# Patient Record
Sex: Male | Born: 1935 | Race: White | Hispanic: No | Marital: Married | Smoking: Never smoker
Health system: Southern US, Community
[De-identification: ages and names within clinical notes are randomized; demographics above are authoritative.]

## PROBLEM LIST (undated history)

## (undated) HISTORY — PX: CORONARY STENT PLACEMENT: SHX1402

---

## 2014-03-18 ENCOUNTER — Encounter (HOSPITAL_COMMUNITY): Payer: Self-pay | Admitting: Emergency Medicine

## 2014-03-18 ENCOUNTER — Emergency Department (HOSPITAL_COMMUNITY)
Admission: EM | Admit: 2014-03-18 | Discharge: 2014-03-18 | Disposition: A | Discharge status: Home / Self Care | Payer: Medicare Other | Attending: Emergency Medicine | Admitting: Emergency Medicine

## 2014-03-18 ENCOUNTER — Emergency Department (HOSPITAL_COMMUNITY): Payer: Medicare Other

## 2014-03-18 DIAGNOSIS — Z9861 Coronary angioplasty status: Secondary | ICD-10-CM | POA: Insufficient documentation

## 2014-03-18 DIAGNOSIS — X58XXXA Exposure to other specified factors, initial encounter: Secondary | ICD-10-CM | POA: Insufficient documentation

## 2014-03-18 DIAGNOSIS — S8012XA Contusion of left lower leg, initial encounter: Secondary | ICD-10-CM

## 2014-03-18 DIAGNOSIS — S8010XA Contusion of unspecified lower leg, initial encounter: Secondary | ICD-10-CM | POA: Insufficient documentation

## 2014-03-18 DIAGNOSIS — Z79899 Other long term (current) drug therapy: Secondary | ICD-10-CM | POA: Insufficient documentation

## 2014-03-18 DIAGNOSIS — Z7982 Long term (current) use of aspirin: Secondary | ICD-10-CM | POA: Insufficient documentation

## 2014-03-18 DIAGNOSIS — Y9389 Activity, other specified: Secondary | ICD-10-CM | POA: Insufficient documentation

## 2014-03-18 DIAGNOSIS — Z7902 Long term (current) use of antithrombotics/antiplatelets: Secondary | ICD-10-CM | POA: Insufficient documentation

## 2014-03-18 DIAGNOSIS — Y9289 Other specified places as the place of occurrence of the external cause: Secondary | ICD-10-CM | POA: Insufficient documentation

## 2014-03-18 LAB — CBC WITH DIFFERENTIAL/PLATELET
BASOS ABS: 0 10*3/uL (ref 0.0–0.1)
BASOS PCT: 0 % (ref 0–1)
EOS ABS: 0 10*3/uL (ref 0.0–0.7)
Eosinophils Relative: 0 % (ref 0–5)
HCT: 41.7 % (ref 39.0–52.0)
Hemoglobin: 14.2 g/dL (ref 13.0–17.0)
Lymphocytes Relative: 19 % (ref 12–46)
Lymphs Abs: 1.9 10*3/uL (ref 0.7–4.0)
MCH: 32.3 pg (ref 26.0–34.0)
MCHC: 34.1 g/dL (ref 30.0–36.0)
MCV: 95 fL (ref 78.0–100.0)
Monocytes Absolute: 0.7 10*3/uL (ref 0.1–1.0)
Monocytes Relative: 7 % (ref 3–12)
NEUTROS PCT: 74 % (ref 43–77)
Neutro Abs: 7.6 10*3/uL (ref 1.7–7.7)
PLATELETS: 176 10*3/uL (ref 150–400)
RBC: 4.39 MIL/uL (ref 4.22–5.81)
RDW: 14.7 % (ref 11.5–15.5)
WBC: 10.2 10*3/uL (ref 4.0–10.5)

## 2014-03-18 LAB — COMPREHENSIVE METABOLIC PANEL
ALK PHOS: 70 U/L (ref 39–117)
ALT: 17 U/L (ref 0–53)
AST: 26 U/L (ref 0–37)
Albumin: 3.9 g/dL (ref 3.5–5.2)
BUN: 24 mg/dL — ABNORMAL HIGH (ref 6–23)
CO2: 26 mEq/L (ref 19–32)
Calcium: 9.2 mg/dL (ref 8.4–10.5)
Chloride: 104 mEq/L (ref 96–112)
Creatinine, Ser: 0.88 mg/dL (ref 0.50–1.35)
GFR calc Af Amer: >90 mL/min (ref >90)
GFR calc non Af Amer: 81 mL/min — ABNORMAL LOW (ref >90)
Glucose, Bld: 93 mg/dL (ref 70–99)
POTASSIUM: 3.9 meq/L (ref 3.7–5.3)
SODIUM: 142 meq/L (ref 137–147)
TOTAL PROTEIN: 6.9 g/dL (ref 6.0–8.3)
Total Bilirubin: 0.3 mg/dL (ref 0.3–1.2)

## 2014-03-18 LAB — APTT: aPTT: 27 seconds (ref 24–37)

## 2014-03-18 LAB — PROTIME-INR
INR: 0.97 (ref 0.00–1.49)
Prothrombin Time: 12.7 seconds (ref 11.6–15.2)

## 2014-03-18 LAB — I-STAT CG4 LACTIC ACID, ED: LACTIC ACID, VENOUS: 0.86 mmol/L (ref 0.5–2.2)

## 2014-03-18 MED ORDER — SODIUM CHLORIDE 0.9 % IV SOLN
INTRAVENOUS | Status: DC
  Administered 2014-03-18: 16:00:00 via INTRAVENOUS

## 2014-03-18 MED ORDER — PIPERACILLIN-TAZOBACTAM 3.375 G IVPB
3.3750 g | Freq: Once | INTRAVENOUS | Status: AC
  Administered 2014-03-18: 3.375 g via INTRAVENOUS
  Filled 2014-03-18: qty 50

## 2014-03-18 MED ORDER — VANCOMYCIN HCL IN DEXTROSE 1-5 GM/200ML-% IV SOLN
1000.0000 mg | Freq: Once | INTRAVENOUS | Status: DC
  Filled 2014-03-18: qty 200

## 2014-03-18 MED ORDER — OXYCODONE-ACETAMINOPHEN 5-325 MG PO TABS: 1.0000 | ORAL_TABLET | ORAL | Status: AC | PRN

## 2014-03-18 MED ORDER — IOHEXOL 300 MG/ML  SOLN
100.0000 mL | Freq: Once | INTRAMUSCULAR | Status: AC | PRN
  Administered 2014-03-18: 100 mL via INTRAVENOUS

## 2014-03-18 NOTE — Progress Notes (Signed)
Orthopedic Tech Progress Note Patient Details:  Ninfa LindenJames Jarnigan 1936-10-14 [id number]  Ortho Devices Type of Ortho Device: Roland RackUnna boot Ortho Device/Splint Location: lle Ortho Device/Splint Interventions: Application   Nikki Domrawford, Francella Barnett 03/18/2014, 8:18 PM

## 2014-03-18 NOTE — Consult Note (Signed)
Reason for Consult: Bruce swelling anterior left tibia concern for necrotizing fasciitis Referring Physician: Dr. Grayce Sessions is an 78 y.o. male.  HPI: Patient is a 78 year old gentleman from [place] who is status post cardiac stents he is on both aspirin and Effient, both antiplatelet drugs. 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 Patient noticed bruising and swelling and was brought to the emergency room.  History reviewed. No pertinent past medical history.  Past Surgical History  Procedure Laterality Date  . Coronary stent placement      History reviewed. No pertinent family history.  Social History:  reports that he has never smoked. He does not have any smokeless tobacco history on file. He reports that he does not drink alcohol or use illicit drugs.  Allergies: No Known Allergies  Medications: I have reviewed the patient's current medications.  Results for orders placed during the hospital encounter of 03/18/14 (from the past 48 hour(s))  CBC WITH DIFFERENTIAL     Status: None   Collection Time    03/18/14  4:00 PM      Result Value Ref Range   WBC 10.2  4.0 - 10.5 K/uL   RBC 4.39  4.22 - 5.81 MIL/uL   Hemoglobin 14.2  13.0 - 17.0 g/dL   HCT 41.7  39.0 - 52.0 %   MCV 95.0  78.0 - 100.0 fL   MCH 32.3  26.0 - 34.0 pg   MCHC 34.1  30.0 - 36.0 g/dL   RDW 14.7  11.5 - 15.5 %   Platelets 176  150 - 400 K/uL   Neutrophils Relative % 74  43 - 77 %   Neutro Abs 7.6  1.7 - 7.7 K/uL   Lymphocytes Relative 19  12 - 46 %   Lymphs Abs 1.9  0.7 - 4.0 K/uL   Monocytes Relative 7  3 - 12 %   Monocytes Absolute 0.7  0.1 - 1.0 K/uL   Eosinophils Relative 0  0 - 5 %   Eosinophils Absolute 0.0  0.0 - 0.7 K/uL   Basophils Relative 0  0 - 1 %   Basophils Absolute 0.0  0.0 - 0.1 K/uL  COMPREHENSIVE METABOLIC PANEL     Status: Abnormal   Collection Time    03/18/14  4:00 PM      Result Value Ref Range   Sodium 142  137 -  147 mEq/L   Potassium 3.9  3.7 - 5.3 mEq/L   Chloride 104  96 - 112 mEq/L   CO2 26  19 - 32 mEq/L   Glucose, Bld 93  70 - 99 mg/dL   BUN 24 (*) 6 - 23 mg/dL   Creatinine, Ser 0.88  0.50 - 1.35 mg/dL   Calcium 9.2  8.4 - 10.5 mg/dL   Total Protein 6.9  6.0 - 8.3 g/dL   Albumin 3.9  3.5 - 5.2 g/dL   AST 26  0 - 37 U/L   ALT 17  0 - 53 U/L   Alkaline Phosphatase 70  39 - 117 U/L   Total Bilirubin 0.3  0.3 - 1.2 mg/dL   GFR calc non Af Amer 81 (*) >90 mL/min   GFR calc Af Amer >90  >90 mL/min   Comment: (NOTE)     The eGFR has been calculated using the CKD EPI equation.     This calculation has not been validated in all clinical situations.  eGFR's persistently <90 mL/min signify possible Chronic Kidney     Disease.  PROTIME-INR     Status: None   Collection Time    03/18/14  4:00 PM      Result Value Ref Range   Prothrombin Time 12.7  11.6 - 15.2 seconds   INR 0.97  0.00 - 1.49  APTT     Status: None   Collection Time    03/18/14  4:00 PM      Result Value Ref Range   aPTT 27  24 - 37 seconds  I-STAT CG4 LACTIC ACID, ED     Status: None   Collection Time    03/18/14  4:19 PM      Result Value Ref Range   Lactic Acid, Venous 0.86  0.5 - 2.2 mmol/L    Dg Tibia/fibula Left  03/18/2014   CLINICAL DATA:  Recent traumatic injury with pain  EXAM: LEFT TIBIA AND FIBULA - 2 VIEW  COMPARISON:  None.  FINDINGS: No acute fracture or dislocation is noted. Degenerative changes about the knee joint are noted. Diffuse vascular calcifications are seen.  IMPRESSION: No acute abnormality noted.   Electronically Signed   By: Inez Catalina M.D.   On: 03/18/2014 16:38   Ct Tibia Fibula Left W Contrast  03/18/2014   CLINICAL DATA:  Leg pain and swelling.  EXAM: CT OF THE LEFT TIBIA AND FIBULA WITH CONTRAST  TECHNIQUE: Multidetector CT imaging of the left lower extremity was performed according to the standard protocol following intravenous contrast administration. Multiplanar CT image  reconstructions were also generated.  CONTRAST:  139m OMNIPAQUE IOHEXOL 300 MG/ML  SOLN  COMPARISON:  None.  FINDINGS: There is diffuse subcutaneous soft tissue swelling/ edema involving the anterior and medial aspect of the lower leg along with a more discrete hematoma measuring 7.5 x 5.5 x 2.5 cm.  The muscular compartment is normal. The fascia planes are maintained. No intramuscular hematoma.  There is a small joint effusion and moderate degenerative changes involving the with chondrocalcinosis, joint space narrowing and osteophytic spurring. No acute fracture or destructive bony changes.  Extensive arterial calcifications are noted. Mixing on opacified blood and contrast noted in the popliteal vein.  IMPRESSION: Large hematoma in the mid tibial region medially and anteriorly with significant surrounding hemorrhage, edema and fluid along the medial aspect of the entire lower leg.  No acute bony findings.  Degenerative changes noted at the knee.  Extensive arterial calcifications.   Electronically Signed   By: MKalman JewelsM.D.   On: 03/18/2014 18:54    Review of Systems  All other systems reviewed and are negative.   Blood pressure 136/78, pulse 66, temperature 98.6 F (37 C), temperature source Oral, resp. rate 18, SpO2 100.00%. Physical Exam On examination patient has a palpable dorsalis pedis pulse in the left leg. There is no crepitation to palpation. He has soft compartments which shows no evidence of compartment syndrome. He does have a hematoma anteriorly over the tibia with no skin no drainage. Review of the CT scan and radiographs shows no evidence of necrotizing fasciitis. He does have a large hematoma anteriorly over the tibia which does not communicate with the compartments. There is no skin ischemia or necrosis. Assessment/Plan: Assessment large hematoma left leg anterior to the tibia with no compartment involvement no necrotizing fasciitis no compartment syndrome no skin ischemia or  necrosis.  Plan: We will have his cardiologist consult to see if we can discontinue his anti- platelet therapy.  I will place an UNNA compressive wrap the left lower extremity. He will adhere to strict elevation 24 hours a day and will followup with a wound clinic or a foot and ankle surgeon in [place]. Discussed that he will need to followup medically weekly to evaluate the leg. If he develops any acute pain or acute drainage he would have to followup emergently. Patient wishes to return to [facility] I will followup as needed.  Hymie Gorr V 03/18/2014, 7:20 PM

## 2014-03-18 NOTE — ED Provider Notes (Signed)
CSN: [id number]     Arrival date & time 03/18/14  1405 History   First MD Initiated Contact with Patient 03/18/14 1540     Chief Complaint  Patient presents with  . Leg Pain     (Consider location/radiation/quality/duration/timing/severity/associated sxs/prior Treatment) Patient is a 78 y.o. male presenting with leg pain. The history is provided by the patient.  Leg Pain  patient here complaining of left lower extremity pain and swelling x4 hours. Patient was working outside and was in weeds and felt something brushed up against his mid lower extremity. He shortly thereafter developed pain characterized as sharp and worse with standing. He is also noted increased swelling to his mid anterior tibia that has been extending up his leg.Marland Kitchen He denies any fever or chills. Denies seeing any snakes. Symptoms have been progressively worse. No treatment used prior to arrival. He does not use any blood thinners. Denies any history of direct trauma. No bruising noted in the other places on his body.  History reviewed. No pertinent past medical history. Past Surgical History  Procedure Laterality Date  . Coronary stent placement     History reviewed. No pertinent family history. History  Substance Use Topics  . Smoking status: Never Smoker   . Smokeless tobacco: Not on file  . Alcohol Use: No    Review of Systems  All other systems reviewed and are negative.      Allergies  Review of patient's allergies indicates no known allergies.  Home Medications   Current Outpatient Rx  Name  Route  Sig  Dispense  Refill  . aspirin EC 81 MG tablet   Oral   Take 81 mg by mouth daily.         Marland Kitchen donepezil (ARICEPT) 5 MG tablet   Oral   Take 5 mg by mouth at bedtime.         . finasteride (PROSCAR) 5 MG tablet   Oral   Take 5 mg by mouth daily.         . fluvastatin XL (LESCOL XL) 80 MG 24 hr tablet   Oral   Take 80 mg by mouth daily.         Marland Kitchen losartan (COZAAR) 50 MG tablet    Oral   Take 50 mg by mouth daily.         . metoprolol succinate (TOPROL-XL) 50 MG 24 hr tablet   Oral   Take 50 mg by mouth daily. Take with or immediately following a meal.         . Multiple Vitamins-Minerals (MULTIVITAMIN WITH MINERALS) tablet   Oral   Take 1 tablet by mouth daily.         . niacin (NIASPAN) 500 MG CR tablet   Oral   Take 2,000 mg by mouth at bedtime.         . Omega-3 Fatty Acids (SALMON OIL-1000 PO)   Oral   Take 2,000 Units by mouth daily.         . prasugrel (EFFIENT) 10 MG TABS tablet   Oral   Take 10 mg by mouth daily.         . Vitamins A & D (VITAMIN A & D) [address] UNITS TABS   Oral   Take 1 tablet by mouth daily.          BP 150/77  Pulse 84  Temp(Src) 98.6 F (37 C) (Oral)  Resp 18  SpO2 96% Physical Exam  Nursing note and  vitals reviewed. Constitutional: He is oriented to person, place, and time. He appears well-developed and well-nourished.  Non-toxic appearance. No distress.  HENT:  Head: Normocephalic and atraumatic.  Eyes: Conjunctivae, EOM and lids are normal. Pupils are equal, round, and reactive to light.  Neck: Normal range of motion. Neck supple. No tracheal deviation present. No mass present.  Cardiovascular: Normal rate, regular rhythm and normal heart sounds.  Exam reveals no gallop.   No murmur heard. Pulmonary/Chest: Effort normal and breath sounds normal. No stridor. No respiratory distress. He has no decreased breath sounds. He has no wheezes. He has no rhonchi. He has no rales.  Abdominal: Soft. Normal appearance and bowel sounds are normal. He exhibits no distension. There is no tenderness. There is no rebound and no CVA tenderness.  Musculoskeletal: Normal range of motion. He exhibits no edema and no tenderness.       Legs: Neurological: He is alert and oriented to person, place, and time. He has normal strength. No cranial nerve deficit or sensory deficit. GCS eye subscore is 4. GCS verbal subscore is  5. GCS motor subscore is 6.  Skin: Skin is warm and dry. No abrasion and no rash noted.  Psychiatric: He has a normal mood and affect. His speech is normal and behavior is normal.    ED Course  Procedures (including critical care time) Labs Review Labs Reviewed  CULTURE, BLOOD (ROUTINE X 2)  CULTURE, BLOOD (ROUTINE X 2)  CBC WITH DIFFERENTIAL  COMPREHENSIVE METABOLIC PANEL  PROTIME-INR  APTT  I-STAT CG4 LACTIC ACID, ED   Imaging Review No results found.   EKG Interpretation None      MDM   Final diagnoses:  None    Spoke with orthopedics on-call and Dr. Lajoyce Cornersduda will come to see the patient. Patient apparently started on antibiotics. Patient rechecked and no extension out the marked lines on the patient's left lower extremity. We'll continue to monitor.  7:46 PM Patient seen by the orthopedist on call and he has no evidence of compartment syndrome. Patient does actually take a blood thinner and the CT showed no signs of necrotizing fasciitis but does show an extensive hematoma. I spoke with the patient's cardiologist, [place] [facility] [facility] [place] and he agrees with taking the patient off of his antiplatelet medication. Straight preterm precautions for compartment syndrome given to the patient. The wound is on call as written for a orthopedic boot. Patient's hematoma was marked when he first arrived and on discharge it is not expanded. He has no evidence of progressive or metastatic. Neurovascular intact.  Toy BakerAnthony T Stanton Kissoon, MD 03/18/14 [phone]

## 2014-03-18 NOTE — ED Notes (Signed)
Ortho called to apply [employer].

## 2014-03-18 NOTE — ED Notes (Signed)
MD Lajoyce Cornersuda with ortho at bedside.

## 2014-03-18 NOTE — ED Notes (Signed)
Pt was working outside cutting brush when he noticed a discomfort in his LLE. He raised his pant leg and saw punctures with bleeding and bruising. Afraid he may have been bitten by a snake. He states there is mild pain at the site. Denies other complaints now.

## 2014-03-18 NOTE — ED Notes (Signed)
Ortho tech at bedside 

## 2014-03-18 NOTE — ED Notes (Signed)
NOTIFIEED DR. A. ALLEN IN PERSON OF PATIENTS LAB RESULTS OF CG4+ LACTIC ACID @16 :19 PM ,03/18/2014.

## 2014-03-18 NOTE — ED Notes (Signed)
Pt discharged home with all belongings, alert, oriented, and ambulatory upon discharge, pt escorted to ED entrance via wheel chair by this RN, 1 new rx prescribed, pt verbalizung understanding of discharge instructions

## 2014-03-18 NOTE — ED Notes (Signed)
Pt presents with concern for a possible snake bite to his Left lower extremity. Pt reports he was working in the yard cutting back bushes in his yard approx 1330. Pt felt there was possibly 2 puncture areas to his Left lower leg. Pt also reports swelling, discoloration, and tightness to his Left lower leg since arriving to the ED.

## 2014-03-18 NOTE — Discharge Instructions (Signed)
Go to the closest hospital if you develop numbness or tingling in your left foot, severe pain to the foot, extension of the swelling to your left lower leg. Followup with your cardiologist as well as with your primary care Dr. Hematoma A hematoma is a collection of blood under the skin, in an organ, in a body space, in a joint space, or in other tissue. The blood can clot to form a lump that you can see and feel. The lump is often firm and may sometimes become sore and tender. Most hematomas get better in a few days to weeks. However, some hematomas may be serious and require medical care. Hematomas can range in size from very small to very large. CAUSES  A hematoma can be caused by a blunt or penetrating injury. It can also be caused by spontaneous leakage from a blood vessel under the skin. Spontaneous leakage from a blood vessel is more likely to occur in older people, especially those taking blood thinners. Sometimes, a hematoma can develop after certain medical procedures. SIGNS AND SYMPTOMS   A firm lump on the body.  Possible pain and tenderness in the area.  Bruising.Blue, dark blue, purple-red, or yellowish skin may appear at the site of the hematoma if the hematoma is close to the surface of the skin. For hematomas in deeper tissues or body spaces, the signs and symptoms may be subtle. For example, an intra-abdominal hematoma may cause abdominal pain, weakness, fainting, and shortness of breath. An intracranial hematoma may cause a headache or symptoms such as weakness, trouble speaking, or a change in consciousness. DIAGNOSIS  A hematoma can usually be diagnosed based on your medical history and a physical exam. Imaging tests may be needed if your health care provider suspects a hematoma in deeper tissues or body spaces, such as the abdomen, head, or chest. These tests may include ultrasonography or a CT scan.  TREATMENT  Hematomas usually go away on their own over time. Rarely does the  blood need to be drained out of the body. Large hematomas or those that may affect vital organs will sometimes need surgical drainage or monitoring. HOME CARE INSTRUCTIONS   Apply ice to the injured area:   Put ice in a plastic bag.   Place a towel between your skin and the bag.   Leave the ice on for 20 minutes, 2 3 times a day for the first 1 to 2 days.   After the first 2 days, switch to using warm compresses on the hematoma.   Elevate the injured area to help decrease pain and swelling. Wrapping the area with an elastic bandage may also be helpful. Compression helps to reduce swelling and promotes shrinking of the hematoma. Make sure the bandage is not wrapped too tight.   If your hematoma is on a lower extremity and is painful, crutches may be helpful for a couple days.   Only take over-the-counter or prescription medicines as directed by your health care provider. SEEK IMMEDIATE MEDICAL CARE IF:   You have increasing pain, or your pain is not controlled with medicine.   You have a fever.   You have worsening swelling or discoloration.   Your skin over the hematoma breaks or starts bleeding.   Your hematoma is in your chest or abdomen and you have weakness, shortness of breath, or a change in consciousness.  Your hematoma is on your scalp (caused by a fall or injury) and you have a worsening headache or a  change in alertness or consciousness. MAKE SURE YOU:   Understand these instructions.  Will watch your condition.  Will get help right away if you are not doing well or get worse. Document Released: 07/17/2004 Document Revised: 08/05/2013 Document Reviewed: 05/13/2013 [facility] Patient Information 2014 [place], [place].

## 2014-03-25 LAB — CULTURE, BLOOD (ROUTINE X 2)
Culture: NO GROWTH
Culture: NO GROWTH

## 2015-01-17 ENCOUNTER — Other Ambulatory Visit (HOSPITAL_COMMUNITY): Payer: Self-pay

## 2015-04-14 IMAGING — CR DG TIBIA/FIBULA 2V*L*
2 series · 2 of 2 positions shown · non-contrast
Comparison: None.

CLINICAL DATA: Recent traumatic injury with pain

EXAM:
LEFT TIBIA AND FIBULA - 2 VIEW

[x tib-fib ap left]
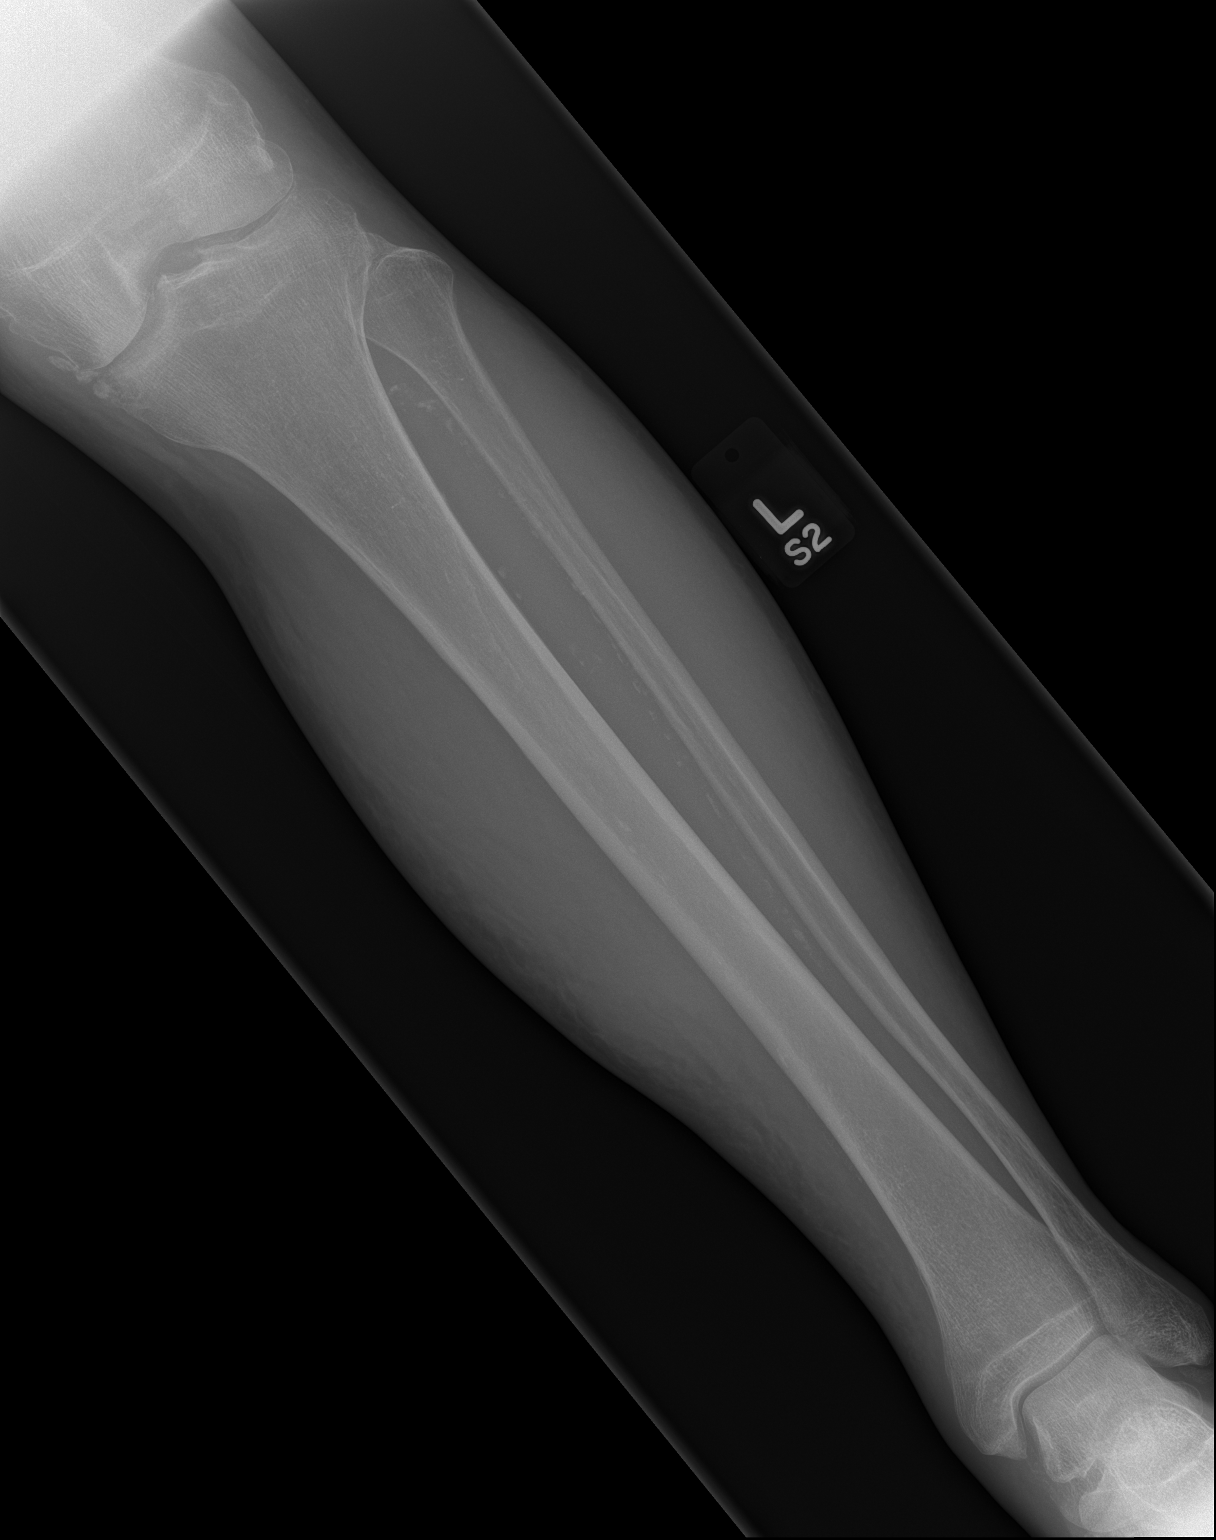

[x tib-fib lat left]
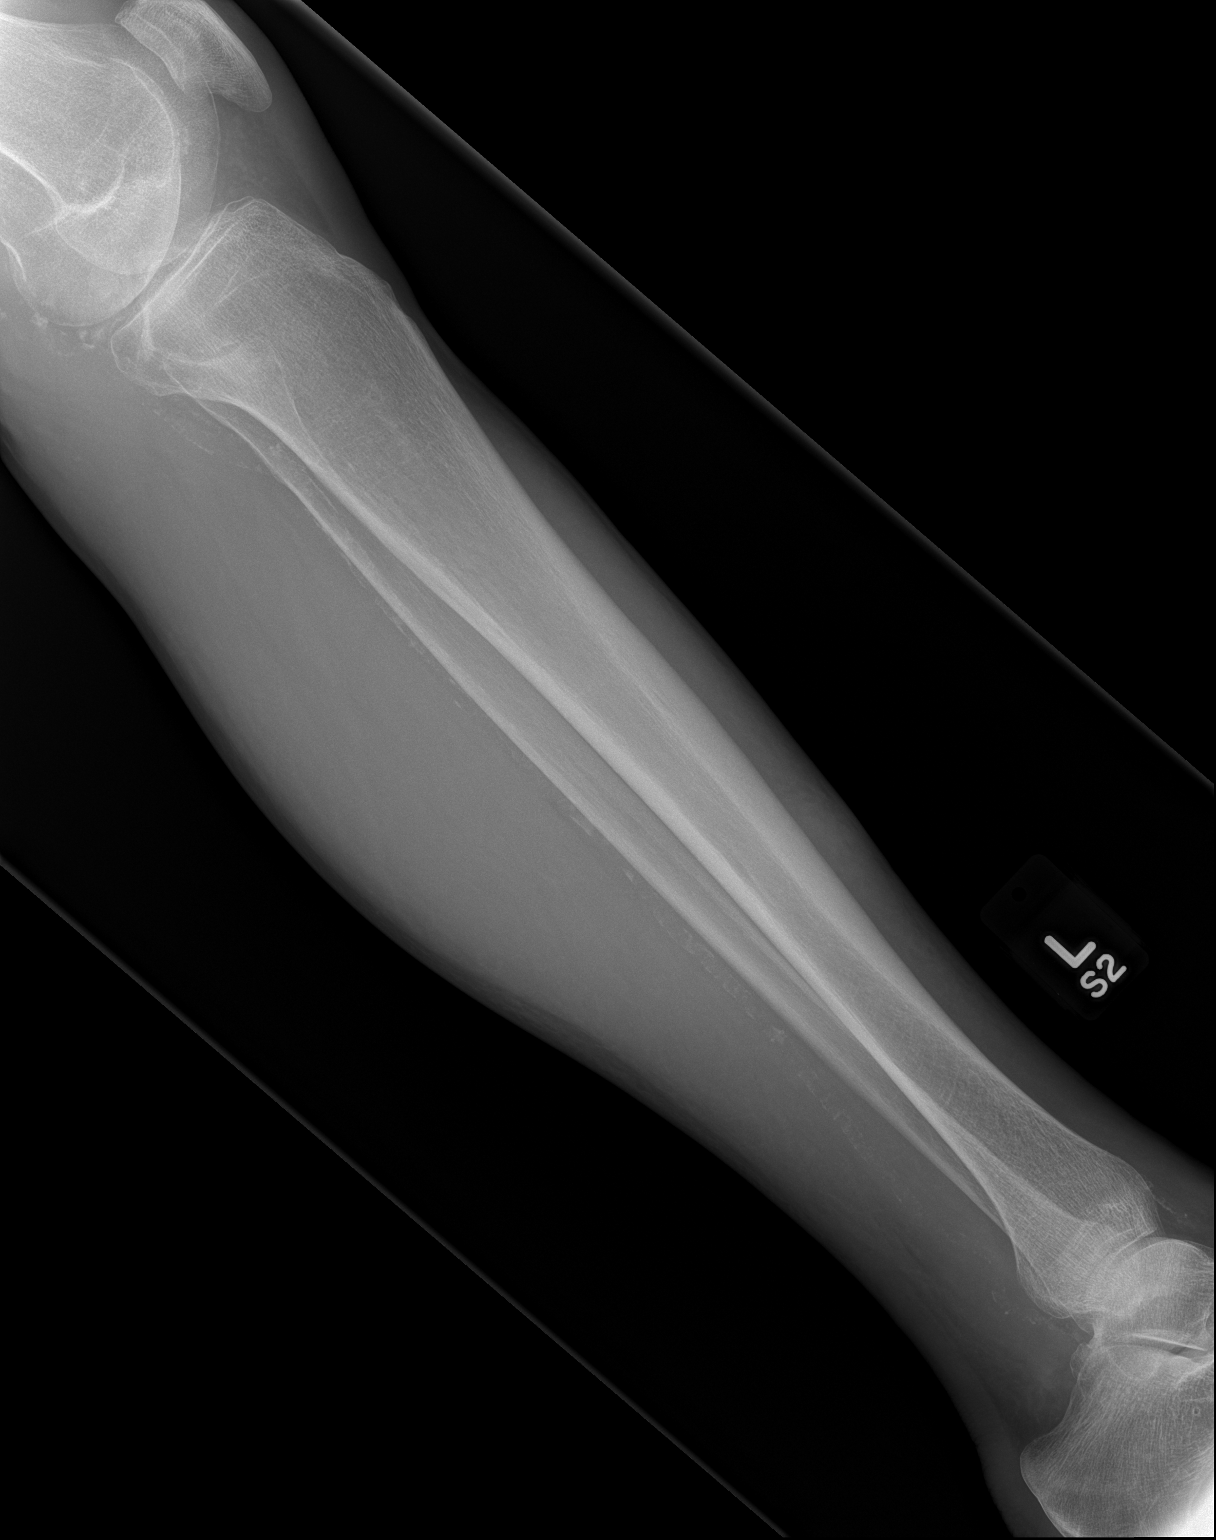

[2 of 2 positions shown; findings below may reference images not displayed]

FINDINGS: No acute fracture or dislocation is noted. Degenerative changes
about the knee joint are noted. Diffuse vascular calcifications are
seen.
IMPRESSION: No acute abnormality noted.
